# Patient Record
Sex: Male | Born: 1995 | Race: Black or African American | Hispanic: No | Marital: Single | Smoking: Never smoker
Health system: Southern US, Community
[De-identification: ages and names within clinical notes are randomized; demographics above are authoritative.]

---

## 2015-01-10 ENCOUNTER — Emergency Department (HOSPITAL_COMMUNITY)
Admission: EM | Admit: 2015-01-10 | Discharge: 2015-01-10 | Disposition: A | Discharge status: Home / Self Care | Payer: Medicaid - Out of State | Attending: Emergency Medicine | Admitting: Emergency Medicine

## 2015-01-10 ENCOUNTER — Emergency Department (HOSPITAL_COMMUNITY): Payer: Medicaid - Out of State

## 2015-01-10 ENCOUNTER — Encounter (HOSPITAL_COMMUNITY): Payer: Self-pay | Admitting: *Deleted

## 2015-01-10 DIAGNOSIS — Y9289 Other specified places as the place of occurrence of the external cause: Secondary | ICD-10-CM | POA: Insufficient documentation

## 2015-01-10 DIAGNOSIS — Y998 Other external cause status: Secondary | ICD-10-CM | POA: Insufficient documentation

## 2015-01-10 DIAGNOSIS — X58XXXA Exposure to other specified factors, initial encounter: Secondary | ICD-10-CM | POA: Diagnosis not present

## 2015-01-10 DIAGNOSIS — Y9389 Activity, other specified: Secondary | ICD-10-CM | POA: Diagnosis not present

## 2015-01-10 DIAGNOSIS — S93402A Sprain of unspecified ligament of left ankle, initial encounter: Secondary | ICD-10-CM | POA: Diagnosis not present

## 2015-01-10 DIAGNOSIS — S99912A Unspecified injury of left ankle, initial encounter: Secondary | ICD-10-CM | POA: Diagnosis present

## 2015-01-10 NOTE — ED Notes (Signed)
Pt rolled his L ankle while playing b-ball today.  States same foot he has already broken.

## 2015-01-10 NOTE — ED Notes (Signed)
PT left because he could not wait any more.

## 2015-01-10 NOTE — Discharge Instructions (Signed)
Rest, Ice intermittently (in the first 24-48 hours), Gentle compression with an Ace wrap, and elevate (Limb above the level of the heart)   Take up to 800mg  of ibuprofen (that is usually 4 over the counter pills)  3 times a day for 5 days. Take with food.  Do not hesitate to return to the emergency room for any new, worsening or concerning symptoms.  Please obtain primary care using resource guide below. Let them know that you were seen in the emergency room and that they will need to obtain records for further outpatient management.   Ankle Sprain An ankle sprain is an injury to the strong, fibrous tissues (ligaments) that hold your ankle bones together.  HOME CARE   Put ice on your ankle for 1-2 days or as told by your doctor.  Put ice in a plastic bag.  Place a towel between your skin and the bag.  Leave the ice on for 15-20 minutes at a time, every 2 hours while you are awake.  Only take medicine as told by your doctor.  Raise (elevate) your injured ankle above the level of your heart as much as possible for 2-3 days.  Use crutches if your doctor tells you to. Slowly put your own weight on the affected ankle. Use the crutches until you can walk without pain.  If you have a plaster splint:  Do not rest it on anything harder than a pillow for 24 hours.  Do not put weight on it.  Do not get it wet.  Take it off to shower or bathe.  If given, use an elastic wrap or support stocking for support. Take the wrap off if your toes lose feeling (numb), tingle, or turn cold or blue.  If you have an air splint:  Add or let out air to make it comfortable.  Take it off at night and to shower and bathe.  Wiggle your toes and move your ankle up and down often while you are wearing it. GET HELP IF:  You have rapidly increasing bruising or puffiness (swelling).  Your toes feel very cold.  You lose feeling in your foot.  Your medicine does not help your pain. GET HELP RIGHT  AWAY IF:   Your toes lose feeling (numb) or turn blue.  You have severe pain that is increasing. MAKE SURE YOU:   Understand these instructions.  Will watch your condition.  Will get help right away if you are not doing well or get worse. Document Released: 10/11/2007 Document Revised: 09/08/2013 Document Reviewed: 11/06/2011 [facility] Patient Information 2015 [place], [place]. This information is not intended to replace advice given to you by your health care provider. Make sure you discuss any questions you have with your health care provider.   Emergency Department Resource Guide 1) Find a Doctor and Pay Out of Pocket Although you won't have to find out who is covered by your insurance plan, it is a good idea to ask around and get recommendations. You will then need to call the office and see if the doctor you have chosen will accept you as a new patient and what types of options they offer for patients who are self-pay. Some doctors offer discounts or will set up payment plans for their patients who do not have insurance, but you will need to ask so you aren't surprised when you get to your appointment.  2) Contact Your Local Health Department Not all health departments have doctors that can see patients  for sick visits, but many do, so it is worth a call to see if yours does. If you don't know where your local health department is, you can check in your phone book. The CDC also has a tool to help you locate your state's health department, and many state websites also have listings of all of their local health departments.  3) Find a Walk-in Clinic If your illness is not likely to be very severe or complicated, you may want to try a walk in clinic. These are popping up all over the country in pharmacies, drugstores, and shopping centers. They're usually staffed by nurse practitioners or physician assistants that have been trained to treat common illnesses and complaints. They're usually  fairly quick and inexpensive. However, if you have serious medical issues or chronic medical problems, these are probably not your best option.  No Primary Care Doctor: - Call Health Connect at  [phone] - they can help you locate a primary care doctor that  accepts your insurance, provides certain services, etc. - Physician Referral Service- [phone]  Chronic Pain Problems: Organization         Address  Phone   Notes  [facility]  [phone] Patients need to be referred by their primary care doctor.   Medication Assistance: Organization         Address  Phone   Notes  [facility] Medication [facility] [address] [place] [place], [place] [address] [phone] --Must be a resident of [facility] -- Must have NO insurance coverage whatsoever (no Medicaid/ Medicare, etc.) -- The pt. MUST have a primary care doctor that directs their care regularly and follows them in the community   [facility]  [phone]   [employer]  [phone]    Agencies that provide inexpensive medical care: Organization         Address  Phone   Notes  [facility]  [phone]   [facility]    [phone]   [facility] [address] [place], [place] [address] [phone]   [facility] [address] [place] [place] [phone]   Planned Parenthood    [phone]   [facility]    [phone]   [facility]  [address], [place] Phone:  [phone], Fax:  [phone] Hours of Operation:  9 am - 6 pm, M-F.  Also accepts Medicaid/Medicare and self-pay.  [facility]  [address], [place] Phone: [phone], Fax: [phone]. Hours of Operation:  8:30 am - 5:30 pm, M-F.  Also accepts Medicaid and self-pay.  [facility] [place] [address], [place] Phone: [phone]   [facility] [address] Natasha Bence [place], [place] [phone], Ext. [phone] Mondays & Thursdays: 7-9 AM.  First 15 patients are seen on a first come, first serve basis.    Medicaid-accepting [facility] Providers:  Organization         Address  Phone   Notes  [facility] [address], [place] [phone] Also accepts self-pay patients.  [facility] [address] Laurell Josephs [place], [place]  [phone]   [facility] [address], [place] [phone]   [facility] [address], [place] [phone])  [phone]   Renaye Rakers [address], [place]   [phone] Only accepts [place] patients after they have their name applied to their card.   Self-Pay (no insurance) in [facility]:  Organization         Address  Phone   Notes  Sickle Cell Patients, [facility] [address] [place], [place] [phone]   [facility] [address] [place], [place] [phone]   [facility] [place]  [address],  [phone]   [facility]/Dr. Osei-Bonsu  [address] [place] or [address], [place] [phone] Phone number for both [place] and [place] locations is the same.  [facility] [address], [place] [phone]   [facility] [address] [place] or [address] [phone]   [facility] [address] [place] [phone], phone; [phone], fax Sees patients 1st and 3rd Saturday of every month.  Must not qualify for public or private insurance (i.e. Medicaid, Medicare, [place] Health Choice, Veterans' Benefits)  Household income should be no more than 200% of the poverty level The clinic cannot treat you if  you are pregnant or think you are pregnant  Sexually transmitted diseases are not treated at the clinic.    Dental Care: Organization         Address  Phone  Notes  [facility] [address] [place], [place] [phone] Accepts children up to age 22 who are enrolled in [place] or [place] Health Choice; pregnant women with a Medicaid card; and children who have applied for Medicaid or [place] Health Choice, but were declined, whose parents can pay a reduced fee at time of service.  [facility] of [facility]  [address], [place] [phone] Accepts children up to age 19 who are enrolled in [place] or [place] Health Choice; pregnant women with a Medicaid card; and children who have applied for Medicaid or  Health Choice, but were declined, whose parents can pay a reduced fee at time of service.  [facility]  [address] [place], [place] [phone] Patients are seen by appointment only. Walk-ins are not accepted. [facility] will see patients 19 years of age and older. Monday - Tuesday (8am-5pm) Most Wednesdays (8:30-5pm) $30 per visit, cash only  [facility] Adult Dental Access [facility]  [address], [facility] [phone] Patients are seen by appointment only. Walk-ins are not accepted. [facility] will see patients 15 years of age and older. One Wednesday Evening (Monthly: Volunteer Based).  $30 per visit, cash only  [employer]  [phone] for adults; Children under age 25, call Graduate Pediatric Dentistry at [phone]. Children aged 46-14, please call [phone] to request a pediatric application.  Dental services are provided in all areas of dental care including fillings, crowns and bridges, complete and partial dentures, implants, gum treatment, root canals, and extractions. Preventive care is also provided. Treatment is  provided to both adults and children. Patients are selected via a lottery and there is often a waiting list.   [facility] [address] [place]  [phone] [email or web address]   [facility] [address] [place],  [place] [phone], Ext. [phone] Second and Fourth Thursday of each month, opens at 6:30 AM; Clinic ends at 9 AM.  Patients are seen on a first-come first-served basis, and a limited number are seen during each clinic.   [facility]  [address] Ether Griffins [place], [place] [phone]   Eligibility Requirements You must have lived in [place], [place], or [place] counties for at least the last three months.   You cannot be eligible for state or federal sponsored [place], including [employer], [place], or [employer].   You generally cannot be eligible for healthcare insurance through your employer.    How to apply: Eligibility screenings are held every Tuesday and Wednesday afternoon from 1:00 pm until 4:00 pm. You do not need an appointment for the interview!  [facility] [address] [place], [place] [phone]   [facility] Health Department  [phone]   [facility] Health Department  [phone]   [facility] Health Department  [phone]    Behavioral Health Resources in the Community: Intensive Outpatient Programs Organization         Address  Phone  Notes  [facility] [address] [place], [place] [phone]   [facility] Outpatient [address] [place], [place] [phone]   [facility] [address] [place], [place]  [phone]   [facility] Mental [facility] [address]  [place], [place] [phone] or [phone]   Substance Abuse Resources Organization         Address  Phone  Notes  [facility]  [phone]   [facility]  [phone]   [place]  [phone]   Floydene Flock  [phone]   Residential & Outpatient Substance Abuse Program  [phone]   Psychological Services Organization         Address  Phone  Notes  [facility] Behavioral Health  [phone]   [facility]  [phone]   [facility] Mental Health [address] [place] [phone] or [phone]    Mobile Crisis Teams Organization         Address  Phone  Notes  [facility], Mobile Crisis Care Unit  [phone]   [facility]  [address] [place], [place] [phone]   Doristine Locks [address] [place] [phone]    Self-Help/Support Groups Organization         Address  Phone             Notes  [facility] - variety of support groups  336- I7437963 Call for more information  Narcotics Anonymous (NA), [facility] [address], [employer] [place]  2 meetings at this location   Statistician         Address  Phone  Notes  [facility] [address],    [place]  [phone]   [facility]  [address], [place] [address], [place], [place] [phone]   [facility] [address] [place], [place] [phone] Admissions: 8am-3pm M-F  [facility] [address],    [place], [place] [phone]   [facility] [address] Starling Manns [place], [place] [phone]   [facility] [address],  [place], [place] [phone]   [facility] - Intensive Outpatient [address] Laurell Josephs [address], [place], [place] [phone]   [facility] ([facility] [address]  [place], [place] [phone] or [phone]   [facility] ([facility]) [address], [place], [place] [phone] Accepts Medicaid  [facility] [address],  [place] [phone] Substance Abuse/Addiction Treatment     [facility] Organization         Address  Phone  Notes  [facility]  [phone]   Angie Fava, PhD [address] [place], [place]   [phone] or [phone]   [facility]   [address] [place], [place] [phone]   [facility] [address] [place], [place] [phone] Insurance/Medicaid/sponsorship through [facility] and [facility] [address]                                    [place], [place] [phone] Therapy/tele-psych/case  [facility] [address], [place] [phone]    Dr. Lolly Mustache  [phone]   [facility]  [employer] [facility] Dept. 1) [address] [place] 2) [address] [place] 3)  [address], [place] [phone]  [phone]   [facility] Child Abuse Hotline [phone]) [phone] or [phone] (After Hours)

## 2015-01-10 NOTE — ED Notes (Signed)
Pt left because he could not wait any longer.

## 2015-01-10 NOTE — ED Provider Notes (Signed)
History  This chart was scribed for non-physician practitioner, Wynetta Emery, PA-C,working with Glynn Octave, MD, by Karle Plumber, ED Scribe. This patient was seen in room TR06C/TR06C and the patient's care was started at 4:42 PM.  Chief Complaint  Patient presents with  . Ankle Pain   The history is provided by the patient and medical records. No language interpreter was used.    HPI Comments:  Brian Moore is a 19 y.o. male who presents to the Emergency Department complaining of a left ankle injury that he sustained a few hours ago while playing basketball. He reports moderate pain  To the lateral left ankle. He states he jumped up and landed on his toes and then inverted the ankle in a backwards direction. He has not taken anything for pain. He is unable to bear weight. He denies modifying factors. He denies numbness, tingling or weakness of the left foot or ankle, bruising, wounds, nausea or vomiting.   History reviewed. No pertinent past medical history. History reviewed. No pertinent past surgical history. No family history on file. Social History  Substance Use Topics  . Smoking status: Never Smoker   . Smokeless tobacco: None  . Alcohol Use: No    Review of Systems A complete 10 system review of systems was obtained and all systems are negative except as noted in the HPI and PMH.   Allergies  Review of patient's allergies indicates no known allergies.  Home Medications   Prior to Admission medications   Not on File   Triage Vitals: BP 106/52 mmHg  Pulse 69  Temp(Src) 98.7 F (37.1 C) (Oral)  Resp 16  SpO2 100% Physical Exam  Constitutional: He is oriented to person, place, and time. He appears well-developed and well-nourished.  HENT:  Head: Normocephalic and atraumatic.  Eyes: EOM are normal.  Neck: Normal range of motion.  Cardiovascular: Normal rate.   Pulmonary/Chest: Effort normal.  Musculoskeletal: Normal range of motion. He exhibits edema  and tenderness.  Left ankle:  No deformity, no overlying skin changes, mild swelling and tenderness to palpation along the inferior, lateral malleolus. No bony tenderness palpation, distally neurovascularly intact.   Neurological: He is alert and oriented to person, place, and time.  Skin: Skin is warm and dry.  Psychiatric: He has a normal mood and affect. His behavior is normal.  Nursing note and vitals reviewed.   ED Course  Procedures (including critical care time) DIAGNOSTIC STUDIES: Oxygen Saturation is 100% on RA, normal by my interpretation.   COORDINATION OF CARE: 4:45 PM- Will X-Ray left ankle and order pain medication and ice pack. Pt verbalizes understanding and agrees to plan.  Medications - No data to display  Labs Review Labs Reviewed - No data to display  Imaging Review No results found. I have personally reviewed and evaluated these images and lab results as part of my medical decision-making.   EKG Interpretation None      MDM   Final diagnoses:  Ankle sprain, left, initial encounter    Filed Vitals:   01/10/15 1625  BP: 106/52  Pulse: 69  Temp: 98.7 F (37.1 C)  TempSrc: Oral  Resp: 16  SpO2: 100%    Medications - No data to display  Brian Moore is a pleasant 19 y.o. male presenting with left foot and ankle pain after he landed on a hyper extended foot while playing basketball. No overlying skin changes. Patient is neurovascularly intact, x-rays negative. Like to treat for sprain with Aircast and crutches,  patient states that he could not wait for orthopedic material and left.    I personally performed the services described in this documentation, which was scribed in my presence. The recorded information has been reviewed and is accurate.    Wynetta Emery, PA-C 01/10/15 2023  Glynn Octave, MD 01/10/15 [phone]

## 2017-02-20 IMAGING — DX DG FOOT COMPLETE 3+V*L*
3 series · 3 of 3 positions shown · non-contrast
Comparison: Left ankle radiographs obtained at the same time.

CLINICAL DATA: Left ankle and dorsal foot pain after rolling his
left foot playing basketball today.

EXAM:
LEFT FOOT - COMPLETE 3+ VIEW

[foot ap]
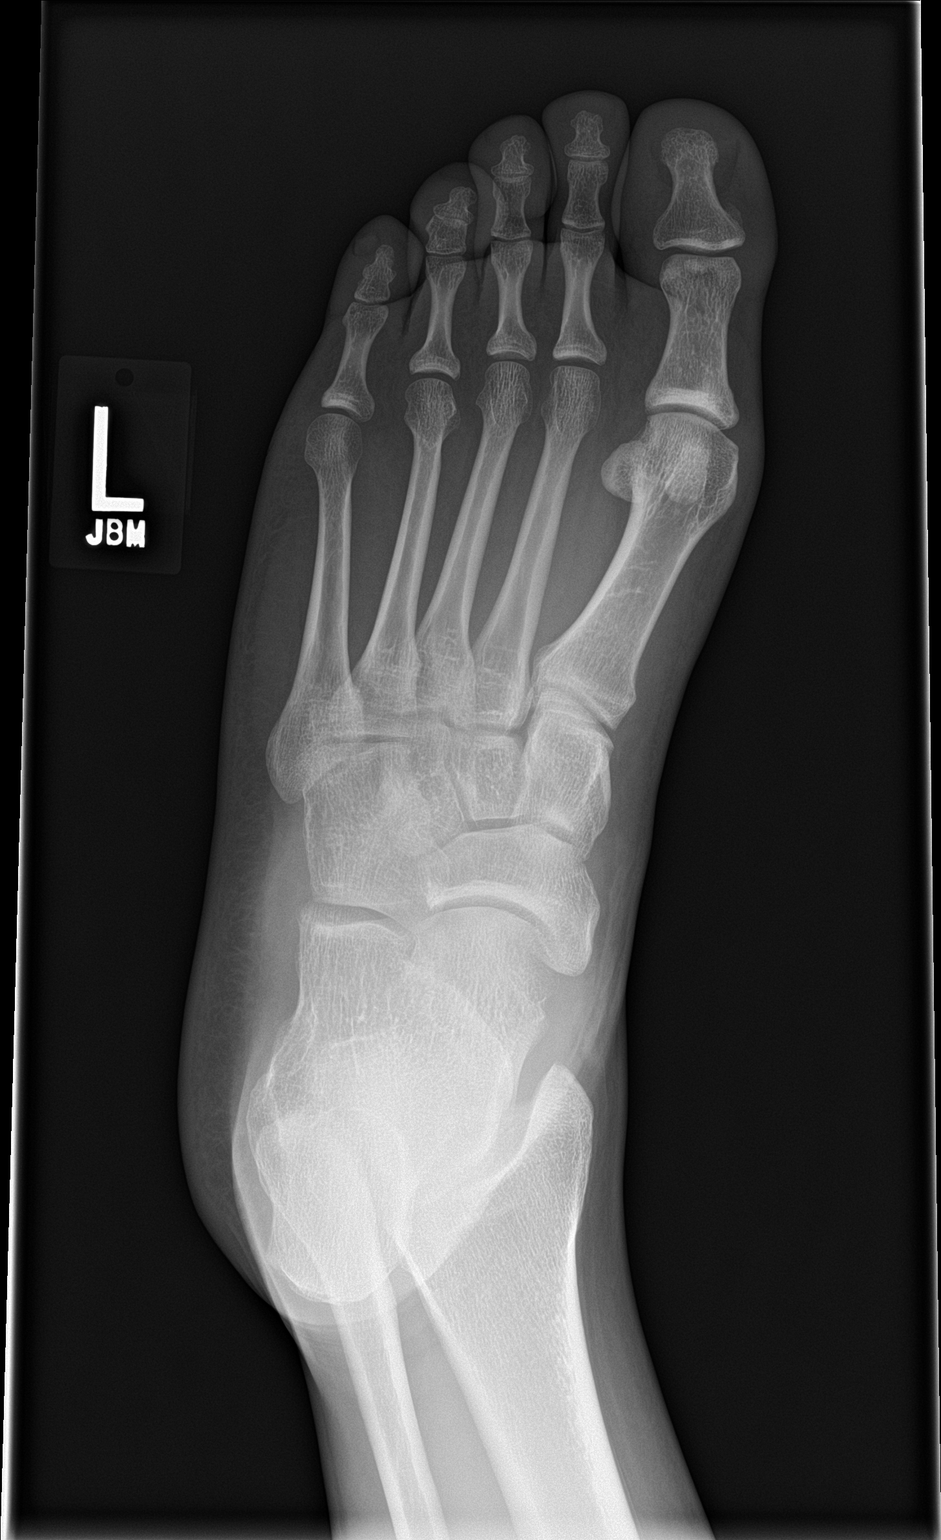

[foot obl]
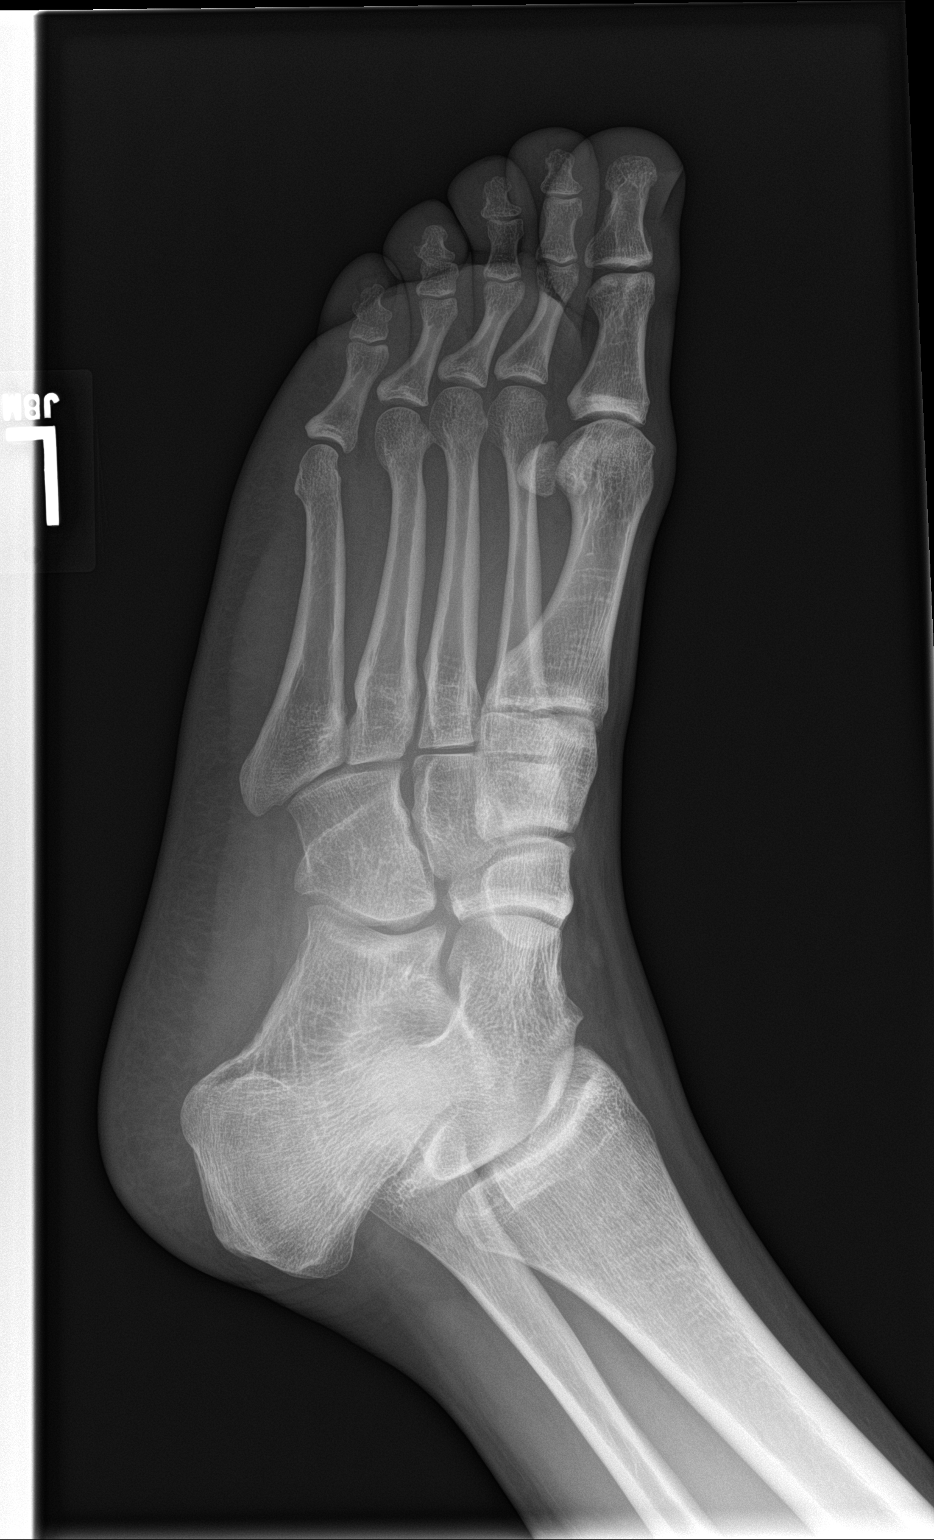

[foot lat]
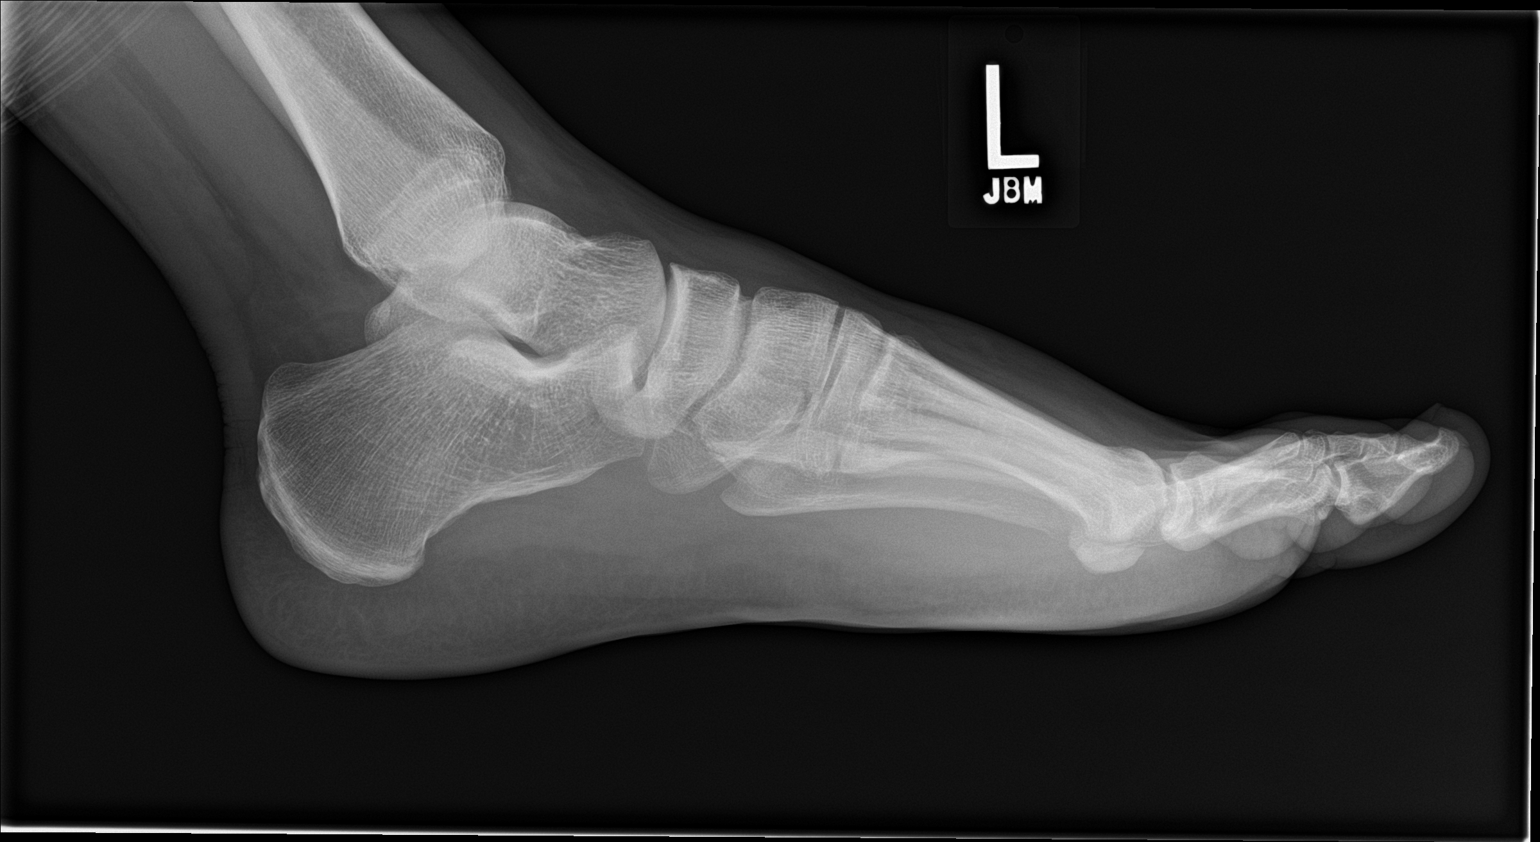

[3 of 3 positions shown; findings below may reference images not displayed]

FINDINGS: There is no evidence of fracture or dislocation. There is no
evidence of arthropathy or other focal bone abnormality. Soft
tissues are unremarkable.
IMPRESSION: Normal examination.

## 2017-02-20 IMAGING — DX DG ANKLE COMPLETE 3+V*L*
3 series · 3 of 3 positions shown · non-contrast
Comparison: None.

CLINICAL DATA: 19-year-old male status post twisting injury playing
basketball yesterday. Ankle pain. Initial encounter.

EXAM:
LEFT ANKLE COMPLETE - 3+ VIEW

[ankle ap]
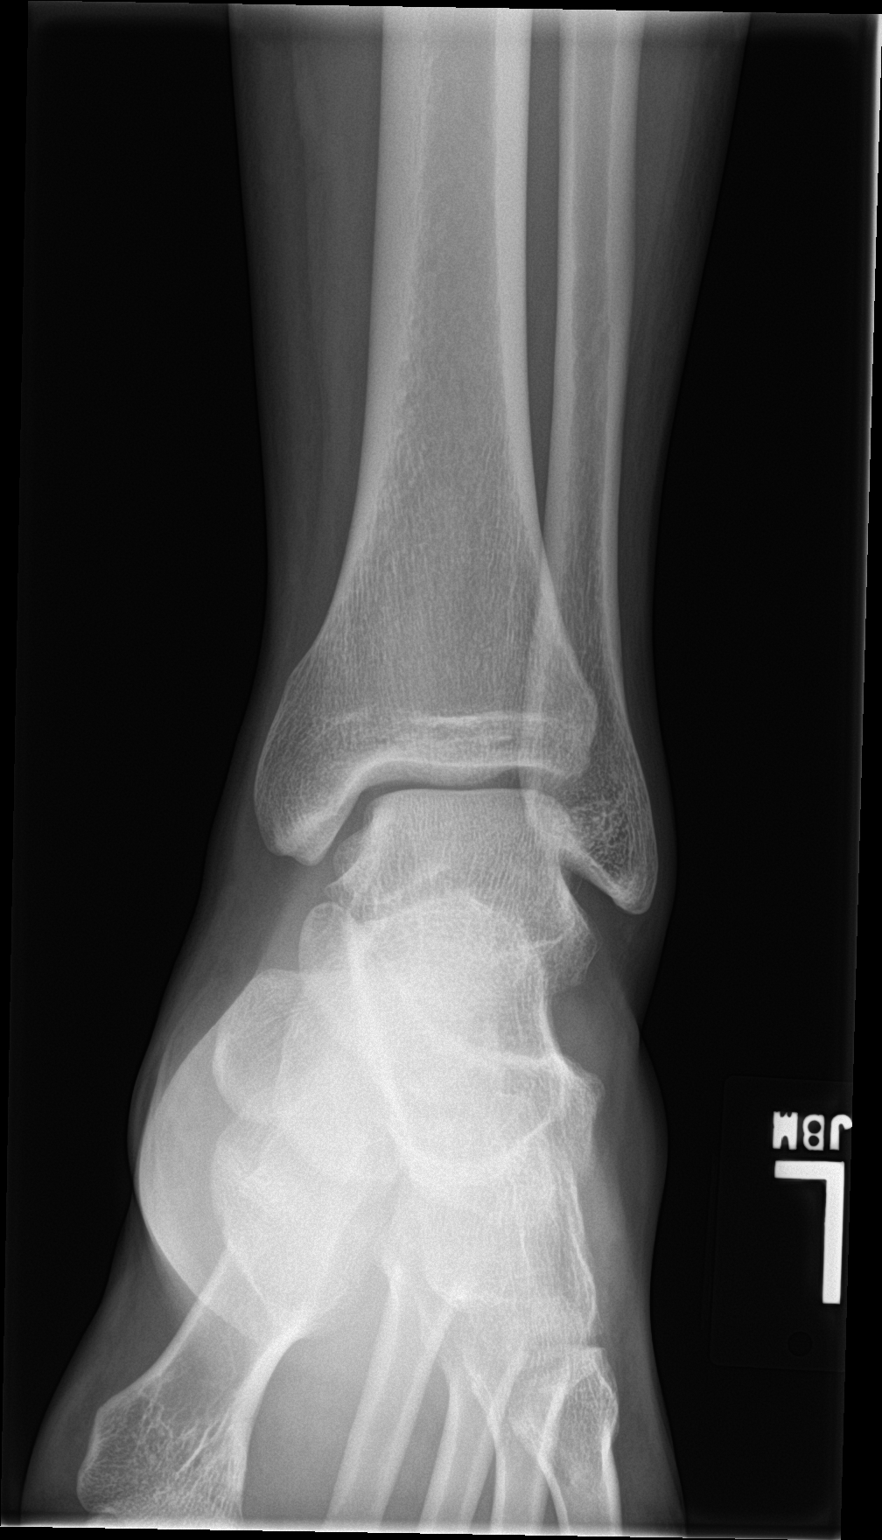

[ankle obl]
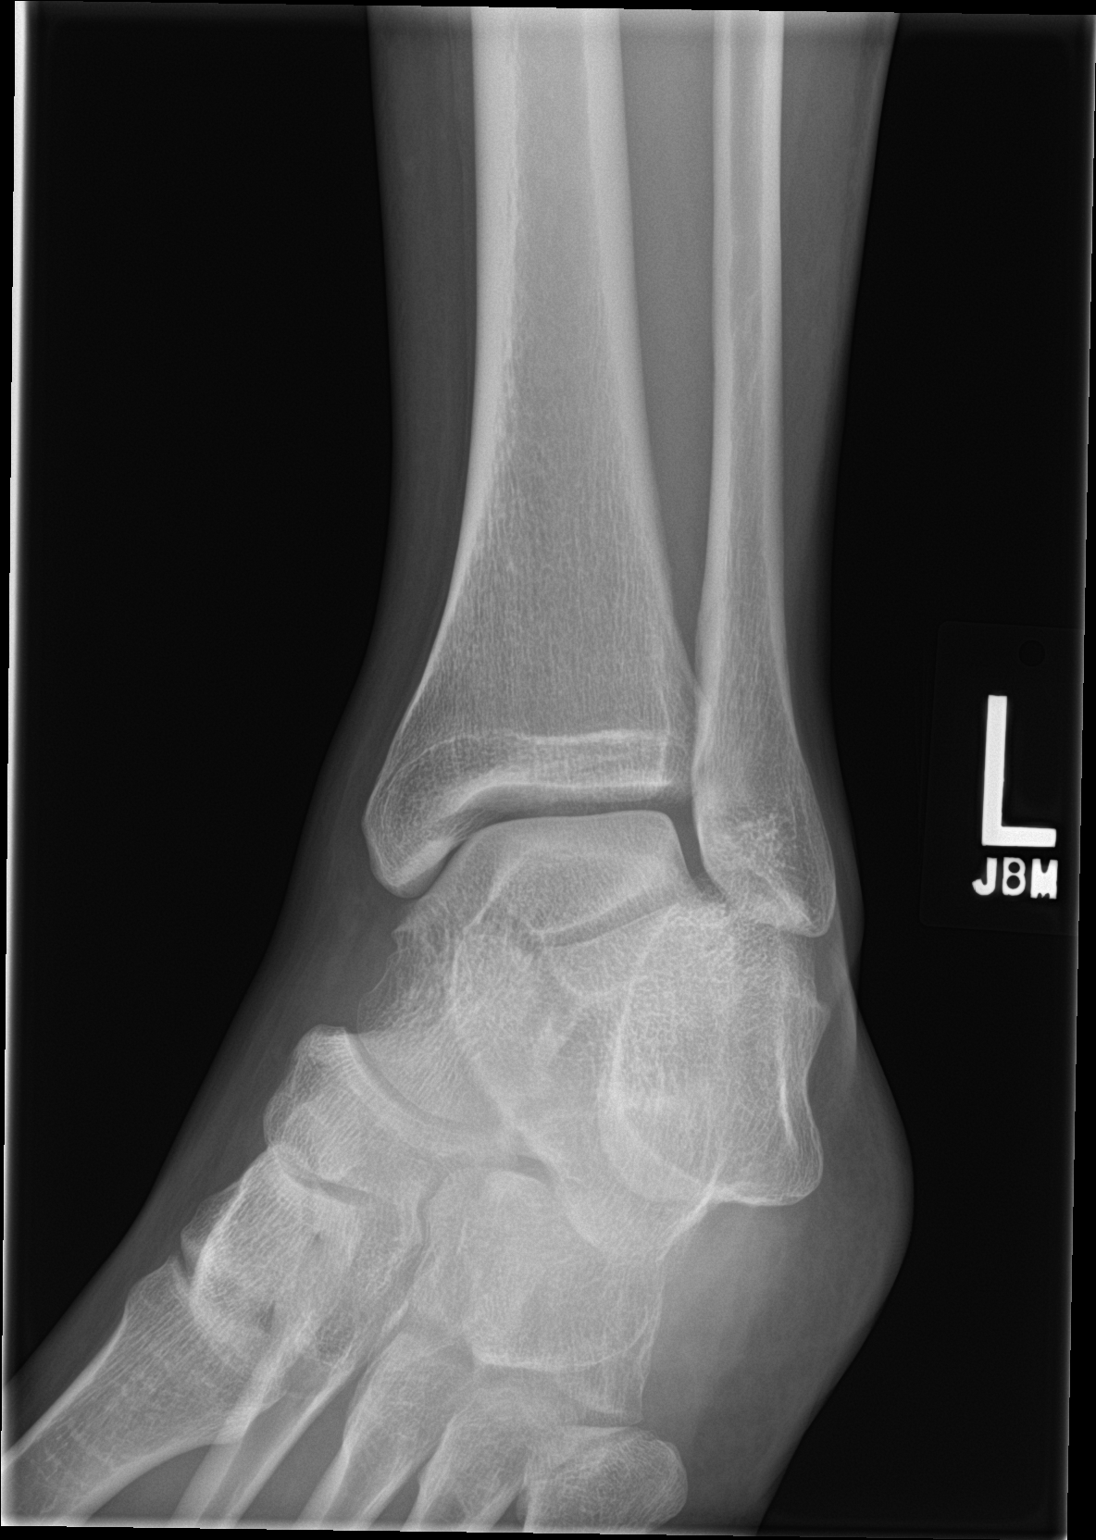

[ankle lat]
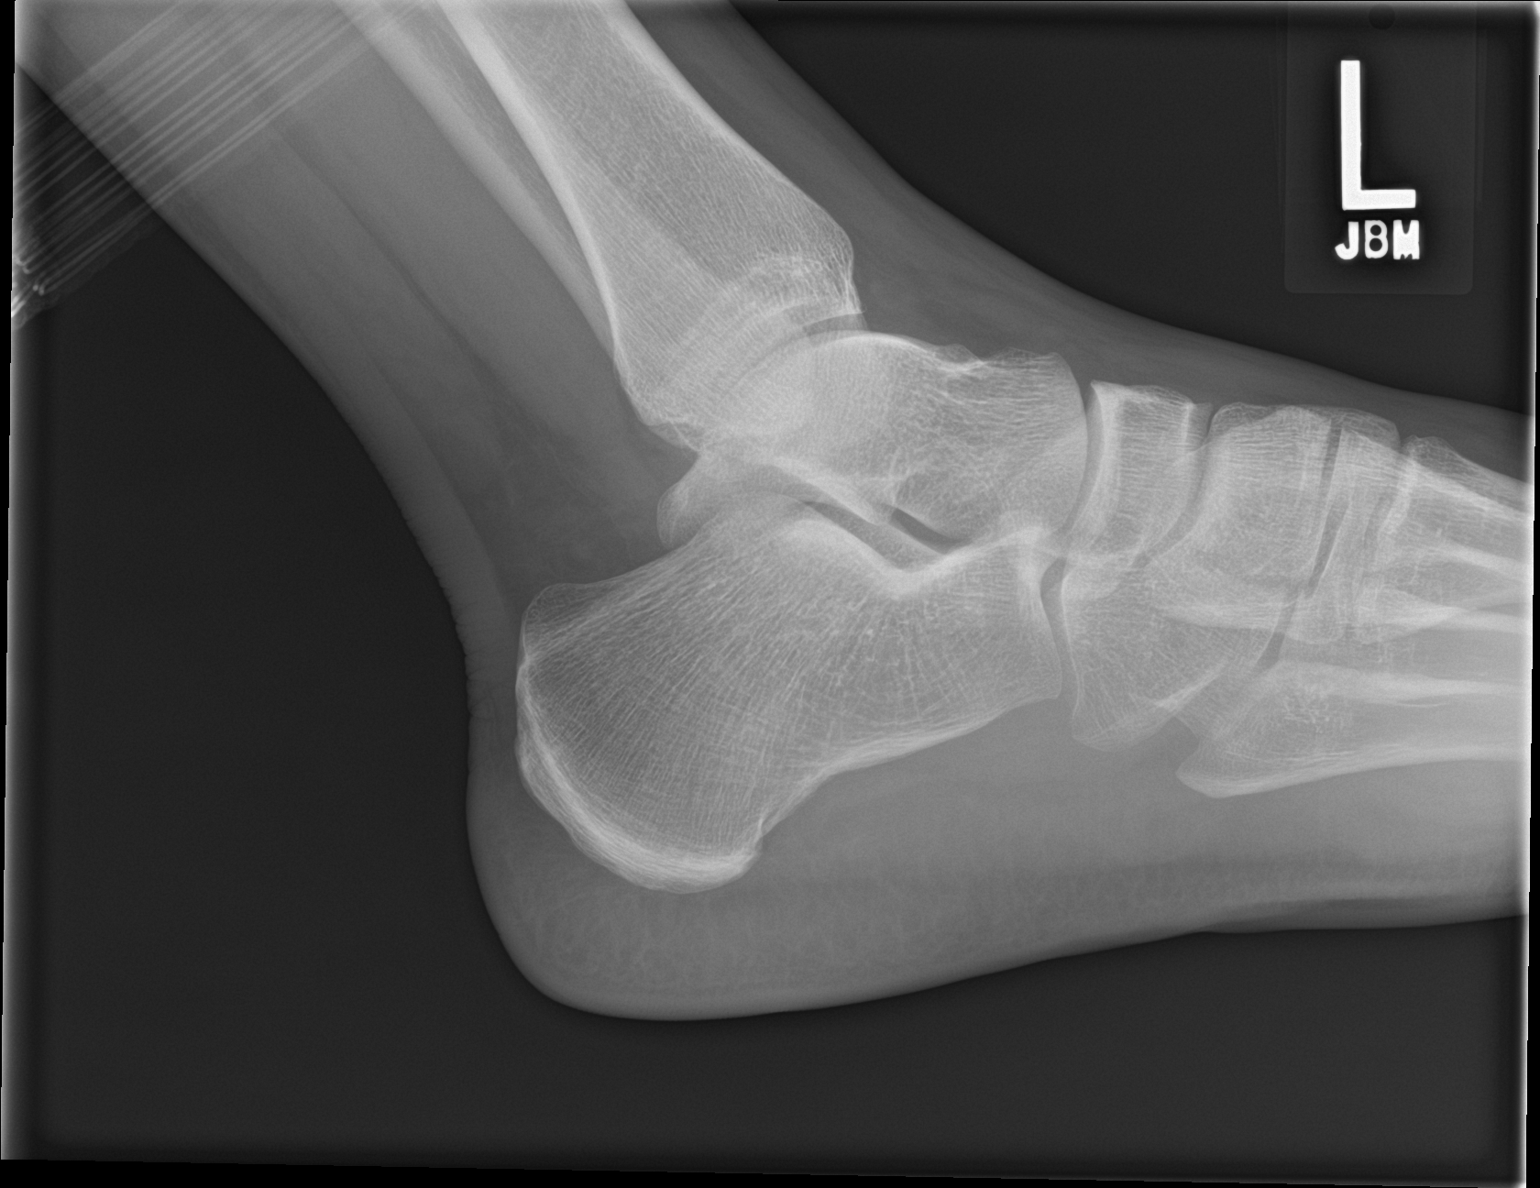

[3 of 3 positions shown; findings below may reference images not displayed]

FINDINGS: No ankle joint effusion identified. Bone mineralization is within
normal limits. Mortise joint alignment preserved. Talar dome intact.
Calcaneus intact. No acute fracture or dislocation.
IMPRESSION: No acute fracture or dislocation identified about the left ankle.
